# Patient Record
Sex: Female | Born: 1980 | Race: White | Hispanic: No | Smoking: Never smoker
Health system: Southern US, Community
[De-identification: ages and names within clinical notes are randomized; demographics above are authoritative.]

## PROBLEM LIST (undated history)

## (undated) DIAGNOSIS — J45909 Unspecified asthma, uncomplicated: Secondary | ICD-10-CM

## (undated) DIAGNOSIS — F0781 Postconcussional syndrome: Secondary | ICD-10-CM

## (undated) DIAGNOSIS — G43909 Migraine, unspecified, not intractable, without status migrainosus: Secondary | ICD-10-CM

---

## 2017-11-16 ENCOUNTER — Other Ambulatory Visit: Payer: Self-pay

## 2017-11-16 ENCOUNTER — Ambulatory Visit
Admission: EM | Admit: 2017-11-16 | Discharge: 2017-11-16 | Disposition: A | Discharge status: Home / Self Care | Payer: BC Managed Care – PPO | Attending: Emergency Medicine | Admitting: Emergency Medicine

## 2017-11-16 DIAGNOSIS — J4521 Mild intermittent asthma with (acute) exacerbation: Secondary | ICD-10-CM

## 2017-11-16 DIAGNOSIS — J069 Acute upper respiratory infection, unspecified: Secondary | ICD-10-CM | POA: Diagnosis not present

## 2017-11-16 HISTORY — DX: Postconcussional syndrome: F07.81

## 2017-11-16 HISTORY — DX: Unspecified asthma, uncomplicated: J45.909

## 2017-11-16 HISTORY — DX: Migraine, unspecified, not intractable, without status migrainosus: G43.909

## 2017-11-16 MED ORDER — DEXAMETHASONE 4 MG PO TABS: ORAL_TABLET | ORAL | 0 refills | Status: DC

## 2017-11-16 MED ORDER — ALBUTEROL SULFATE HFA 108 (90 BASE) MCG/ACT IN AERS: 1.0000 | INHALATION_SPRAY | Freq: Four times a day (QID) | RESPIRATORY_TRACT | 0 refills | Status: AC | PRN

## 2017-11-16 MED ORDER — AEROCHAMBER PLUS MISC: 2 refills | Status: AC

## 2017-11-16 MED ORDER — DEXAMETHASONE SODIUM PHOSPHATE 10 MG/ML IJ SOLN
10.0000 mg | Freq: Once | INTRAMUSCULAR | Status: AC
  Administered 2017-11-16: 10 mg via INTRAMUSCULAR

## 2017-11-16 NOTE — ED Provider Notes (Signed)
HPI  SUBJECTIVE:  Autumn Smith is a 37 y.o. female who presents with diffuse chest tightness, shortness of breath, dyspnea on exertion, nonproductive cough for the past several days.  She states that she has been checking her peak flows at home and states that they have been consistently lower than normal in the 400s.  She states her baseline peak flow is 500.  She states that she had an upper respiratory infection with nasal congestion, rhinorrhea, postnasal drip, which is largely resolved but states that is triggered off her asthma.  She has been using her albuterol neb 3-4 times a day with improvement in her symptoms.  She states that normally she never uses the albuterol.  She has also tried over-the-counter cold medicines.  She states that her symptoms are worse at night and when she does not get her albuterol nebs on a regular basis.  She denies calf pain, swelling, hemoptysis, surgery in the past 4 weeks, exogenous estrogen, prolonged immobilization, PE, DVT.  No posttussive emesis.  No unintentional weight gain, nocturia, orthopnea, PND, abdominal pain.  No antibiotics in the past month.  She did take an antipyretic within 6-8 hours of evaluation.  She also states that she had a nebulizer treatment immediately prior to arrival.  She has a past medical history of asthma, no recent steroid use, admissions or intubations.  She has a past medical history of migraines.  No history of PE, DVT, CHF, diabetes, hypertension.  LMP: 12/31.  She denies the possibility of being pregnant.  NWG:NFAOZ, Leotis Shames, MD    Past Medical History:  Diagnosis Date  . Asthma   . Migraines   . Post concussion syndrome     History reviewed. No pertinent surgical history.  History reviewed. No pertinent family history.  Social History   Tobacco Use  . Smoking status: Never Smoker  . Smokeless tobacco: Never Used  Substance Use Topics  . Alcohol use: No    Frequency: Never  . Drug use: No     Current  Facility-Administered Medications:  .  dexamethasone (DECADRON) injection 10 mg, 10 mg, Intramuscular, Once, Domenick Gong, MD  Current Outpatient Medications:  .  albuterol (PROVENTIL) (2.5 MG/3ML) 0.083% nebulizer solution, Take 2.5 mg by nebulization every 6 (six) hours as needed for wheezing or shortness of breath., Disp: , Rfl:  .  cetirizine (ZYRTEC) 10 MG tablet, Take 10 mg by mouth daily., Disp: , Rfl:  .  ETODOLAC PO, Take by mouth., Disp: , Rfl:  .  guaiFENesin (MUCINEX) 600 MG 12 hr tablet, Take by mouth 2 (two) times daily., Disp: , Rfl:  .  nortriptyline (PAMELOR) 10 MG capsule, Take 30 mg by mouth at bedtime., Disp: , Rfl:  .  Pseudoephedrine-DM-GG (SUDAFED COUGH PO), Take by mouth., Disp: , Rfl:  .  albuterol (PROVENTIL HFA;VENTOLIN HFA) 108 (90 Base) MCG/ACT inhaler, Inhale 1-2 puffs into the lungs every 6 (six) hours as needed for wheezing or shortness of breath., Disp: 1 Inhaler, Rfl: 0 .  dexamethasone (DECADRON) 4 MG tablet, 4 tablets (16 mg) po at once tomorrow, Disp: 4 tablet, Rfl: 0 .  Spacer/Aero-Holding Chambers (AEROCHAMBER PLUS) inhaler, Use as instructed, Disp: 1 each, Rfl: 2  No Known Allergies   ROS  As noted in HPI.   Physical Exam  BP 134/73 (BP Location: Left Arm)   Pulse (!) 129   Temp 98.1 F (36.7 C) (Oral)   Ht 5\' 6"  (1.676 m)   Wt 125 lb (56.7 kg)  LMP 11/03/2017 (Approximate)   SpO2 100%   BMI 20.18 kg/m   Constitutional: Well developed, well nourished, no acute distress Eyes:  EOMI, conjunctiva normal bilaterally HENT: Normocephalic, atraumatic,mucus membranes moist Respiratory: Normal inspiratory effort, good air movement, no rales, rhonchi, wheezing.  No chest wall tenderness Cardiovascular: Regular tachycardia, no murmurs, rubs, gallops. GI: nondistended skin: No rash, skin intact Musculoskeletal: Calves symmetric, nontender, no edema.  No deformities Neurologic: Alert & oriented x 3, no focal neuro deficits Psychiatric:  Speech and behavior appropriate   ED Course   Medications  dexamethasone (DECADRON) injection 10 mg (not administered)    Orders Placed This Encounter  Procedures  . Peak flow    Standing Status:   Standing    Number of Occurrences:   1    Order Specific Question:   Pre and post Neb    Answer:   Yes    No results found for this or any previous visit (from the past 24 hour(s)). No results found.  ED Clinical Impression  Mild intermittent asthma with exacerbation  Upper respiratory tract infection, unspecified type   ED Assessment/Plan  Patient states her best peak flow is 500.  It is 350/420/420 here in the clinic.  She declined another nebulizer treatment, saying that she had one immediately prior to arrival and was still feeling tachycardic/shaky.  Presentation consistent with an asthma exacerbation due to a URI.  Doubt PE, CHF.  Will give dexamethasone 10 mg IM here, prescribe an albuterol inhaler with a spacer.  16 mg of dexamethasone for her to take po at once tomorrow.  She will need no further steroids after that.  She will do 2 puffs from her albuterol inhaler every 4-6 hours, continue keeping a log of her peak flows.  She will follow up with her primary care physician as needed, and will go to the ER if she gets worse.  Imaging deferred as patient had no fevers, focal lung findings.  Discussed MDM, plan and followup with patient. Discussed sn/sx that should prompt return to the ED. patient agrees with plan.   Meds ordered this encounter  Medications  . dexamethasone (DECADRON) injection 10 mg  . Spacer/Aero-Holding Chambers (AEROCHAMBER PLUS) inhaler    Sig: Use as instructed    Dispense:  1 each    Refill:  2  . albuterol (PROVENTIL HFA;VENTOLIN HFA) 108 (90 Base) MCG/ACT inhaler    Sig: Inhale 1-2 puffs into the lungs every 6 (six) hours as needed for wheezing or shortness of breath.    Dispense:  1 Inhaler    Refill:  0  . dexamethasone (DECADRON) 4 MG  tablet    Sig: 4 tablets (16 mg) po at once tomorrow    Dispense:  4 tablet    Refill:  0    *This clinic note was created using Scientist, clinical (histocompatibility and immunogenetics)Dragon dictation software. Therefore, there may be occasional mistakes despite careful proofreading.   ?   Domenick GongMortenson, Blayton Huttner, MD 11/16/17 1323

## 2017-11-16 NOTE — Discharge Instructions (Signed)
Take two puffs from your albuterol inhaler every 4 hours. Finish the steroids unless your doctor tells you to stop.  Do a peak flow, once the morning and once at night. Write this down. The number should be going up, not down. You may decrease the frequency of your albuterol inhaler as the numbers go up and you start feeling better. You may start the steroids tomorrow, as you have already taken today's dose. You may take tylenol 1 gram up to 4 times a day as needed for pain. This with 600 mg of motrin is an effective combination for pain and fever. Make sure you drink extra fluids. Return if you get worse, have a fever >100.4, or any other concerns.   Go to www.goodrx.com to look up your medications. This will give you a list of where you can find your prescriptions at the most affordable prices. Or ask the pharmacist what the cash price is, or if they have any other discount programs available to help make your medication more affordable. This can be less expensive than what you would pay with insurance.

## 2017-11-16 NOTE — ED Triage Notes (Signed)
Patient has a hx of asthma, she states she has had a cough and sob for 6 days. Patient states she has been doing 3-4 albuterol treatments a day since Monday. She had her last nebulizer treatment this morning at 9 am. Patient also states she was just started on noritriptyline and read the insert which states not to take this with albuterol.

## 2017-11-19 ENCOUNTER — Telehealth: Payer: Self-pay

## 2017-11-19 NOTE — Telephone Encounter (Signed)
Called to follow up with patient since visit here at [facility]. Patient instructed to call back with any questions or concerns. MAH  

## 2018-06-18 ENCOUNTER — Ambulatory Visit (INDEPENDENT_AMBULATORY_CARE_PROVIDER_SITE_OTHER): Payer: BC Managed Care – PPO

## 2018-06-18 ENCOUNTER — Encounter: Payer: Self-pay | Admitting: Emergency Medicine

## 2018-06-18 ENCOUNTER — Other Ambulatory Visit: Payer: Self-pay

## 2018-06-18 ENCOUNTER — Ambulatory Visit
Admission: EM | Admit: 2018-06-18 | Discharge: 2018-06-18 | Disposition: A | Discharge status: Home / Self Care | Payer: BC Managed Care – PPO | Attending: Family Medicine | Admitting: Family Medicine

## 2018-06-18 DIAGNOSIS — S99922A Unspecified injury of left foot, initial encounter: Secondary | ICD-10-CM | POA: Diagnosis not present

## 2018-06-18 DIAGNOSIS — W2209XA Striking against other stationary object, initial encounter: Secondary | ICD-10-CM | POA: Diagnosis not present

## 2018-06-18 DIAGNOSIS — M79672 Pain in left foot: Secondary | ICD-10-CM

## 2018-06-18 MED ORDER — MELOXICAM 15 MG PO TABS: 15.0000 mg | ORAL_TABLET | Freq: Every day | ORAL | 0 refills | Status: AC | PRN

## 2018-06-18 NOTE — ED Provider Notes (Signed)
[facility]    CSN: [id number] Arrival date & time: 06/18/18  0901  History   Chief Complaint Chief Complaint  Patient presents with  . Foot Pain   HPI  37 year old female presents to the urgent care with complaints of left foot pain.  Left foot pain  Injury 3 weeks ago. Was sleeping in a basement and kicked an entertainment center accidentally during sleep.   Patient reports that the injury was to the dorsum of the left foot.  Patient reports that the pain has not improved.  Moderate in severity.  She states that it still hurts when she ambulates.  Patient states she does not feel like it is broken.  She is not taking any medication or trying any interventions.  No known relieving factors.  No other associated symptoms.  No other complaints.  Past Medical History:  Diagnosis Date  . Asthma   . Migraines   . Post concussion syndrome    History reviewed. No pertinent surgical history.  OB History   None    Home Medications    Prior to Admission medications   Medication Sig Start Date End Date Taking? Authorizing Provider  Fluticasone-Salmeterol (ADVAIR) 250-50 MCG/DOSE AEPB Inhale 1 puff into the lungs 2 (two) times daily.   Yes [provider]  venlafaxine (EFFEXOR) 37.5 MG tablet Take 75 mg by mouth once.   Yes [provider]  albuterol (PROVENTIL HFA;VENTOLIN HFA) 108 (90 Base) MCG/ACT inhaler Inhale 1-2 puffs into the lungs every 6 (six) hours as needed for wheezing or shortness of breath. 11/16/17   Domenick GongMortenson, Ashley, MD  albuterol (PROVENTIL) (2.5 MG/3ML) 0.083% nebulizer solution Take 2.5 mg by nebulization every 6 (six) hours as needed for wheezing or shortness of breath.    [provider]  cetirizine (ZYRTEC) 10 MG tablet Take 10 mg by mouth daily.    [provider]  meloxicam (MOBIC) 15 MG tablet Take 1 tablet (15 mg total) by mouth daily as needed. 06/18/18   Tommie Samsook, Dazaria Macneill G, DO  Spacer/Aero-Holding  Chambers (AEROCHAMBER PLUS) inhaler Use as instructed 11/16/17   Domenick GongMortenson, Ashley, MD   Social History Social History   Tobacco Use  . Smoking status: Never Smoker  . Smokeless tobacco: Never Used  Substance Use Topics  . Alcohol use: No    Frequency: Never  . Drug use: No    Allergies   Patient has no known allergies.   Review of Systems Review of Systems  Musculoskeletal:       Left foot pain.  Skin:       No bruising noted.   Physical Exam Triage Vital Signs ED Triage Vitals  Enc Vitals Group     BP 06/18/18 0914 117/82     Pulse Rate 06/18/18 0914 (!) 102     Resp 06/18/18 0914 14     Temp 06/18/18 0914 98.4 F (36.9 C)     Temp Source 06/18/18 0914 Oral     SpO2 06/18/18 0914 100 %     Weight 06/18/18 0914 125 lb (56.7 kg)     Height 06/18/18 0914 5\' 6"  (1.676 m)     Head Circumference --      Peak Flow --      Pain Score 06/18/18 0913 4     Pain Loc --      Pain Edu? --      Excl. in GC? --    Updated Vital Signs BP 117/82 (BP Location: Left Arm)  Pulse (!) 102   Temp 98.4 F (36.9 C) (Oral)   Resp 14   Ht 5\' 6"  (1.676 m)   Wt 56.7 kg   LMP 06/04/2018 (Approximate) Comment: denies preg  SpO2 100%   BMI 20.18 kg/m   Visual Acuity Right Eye Distance:   Left Eye Distance:   Bilateral Distance:    Right Eye Near:   Left Eye Near:    Bilateral Near:     Physical Exam  Constitutional: She is oriented to person, place, and time. She appears well-developed. No distress.  Pulmonary/Chest: Effort normal. No respiratory distress.  Musculoskeletal:  Left foot -normal inspection.  No discrete areas of tenderness on exam.  Normal range of motion.  Normal range of motion of the ankle as well. Pes planus noted.  Neurological: She is alert and oriented to person, place, and time.  Skin: Skin is warm.  No bruising.  Psychiatric: She has a normal mood and affect. Her behavior is normal.  Nursing note and vitals reviewed.  UC Treatments / Results    Labs (all labs ordered are listed, but only abnormal results are displayed) Labs Reviewed - No data to display  EKG None  Radiology Dg Foot Complete Left  Result Date: 06/18/2018 CLINICAL DATA:  37 year old female with 3 weeks of pain after kicking piece of furniture. Fourth metatarsal and plantar 4th MTP region pain. EXAM: LEFT FOOT - COMPLETE 3+ VIEW COMPARISON:  None. FINDINGS: Bone mineralization is within normal limits. There is no evidence of fracture or dislocation. Normal joint spaces and alignment. No soft tissue abnormality identified. IMPRESSION: Negative. Electronically Signed   By: Odessa FlemingH  Hall M.D.   On: 06/18/2018 09:57   Procedures Procedures (including critical care time)  Medications Ordered in UC Medications - No data to display  Initial Impression / Assessment and Plan / UC Course  I have reviewed the triage vital signs and the nursing notes.  Pertinent labs & imaging results that were available during my care of the patient were reviewed by me and considered in my medical decision making (see chart for details).    37 year old female presents with a foot injury.  Her exam is benign.  X-ray negative.  I advised meloxicam as needed.  Slow return to normal activities.  Arch support and metatarsal pad.  Final Clinical Impressions(s) / UC Diagnoses   Final diagnoses:  Injury of left foot, initial encounter     Discharge Instructions     Slow return to normal activity.  Medication as needed. Will help with pain & inflammation.  Take care  Dr. Adriana Simasook     ED Prescriptions    Medication Sig Dispense Auth. Provider   meloxicam (MOBIC) 15 MG tablet Take 1 tablet (15 mg total) by mouth daily as needed. 30 tablet Tommie Samsook, Dalisa Forrer G, DO     Controlled Substance Prescriptions [place] Controlled Substance Registry consulted? Not Applicable   Tommie SamsCook, Amesha Bailey G, DO 06/18/18 1016

## 2018-06-18 NOTE — ED Triage Notes (Signed)
Patient states that 3 weeks ago she kicked that entertainment center with her left foot during her sleep.  Patient c.o left foot pain.

## 2018-06-18 NOTE — Discharge Instructions (Signed)
Slow return to normal activity.  Medication as needed. Will help with pain & inflammation.  Take care  Dr. Adriana Simasook

## 2019-03-06 IMAGING — CR DG FOOT COMPLETE 3+V*L*
3 series · 3 of 3 positions shown · non-contrast
Comparison: None.

CLINICAL DATA: 37-year-old female with 3 weeks of pain after
kicking piece of furniture. Fourth metatarsal and plantar 4th MTP
region pain.

EXAM:
LEFT FOOT - COMPLETE 3+ VIEW

[foot ap]
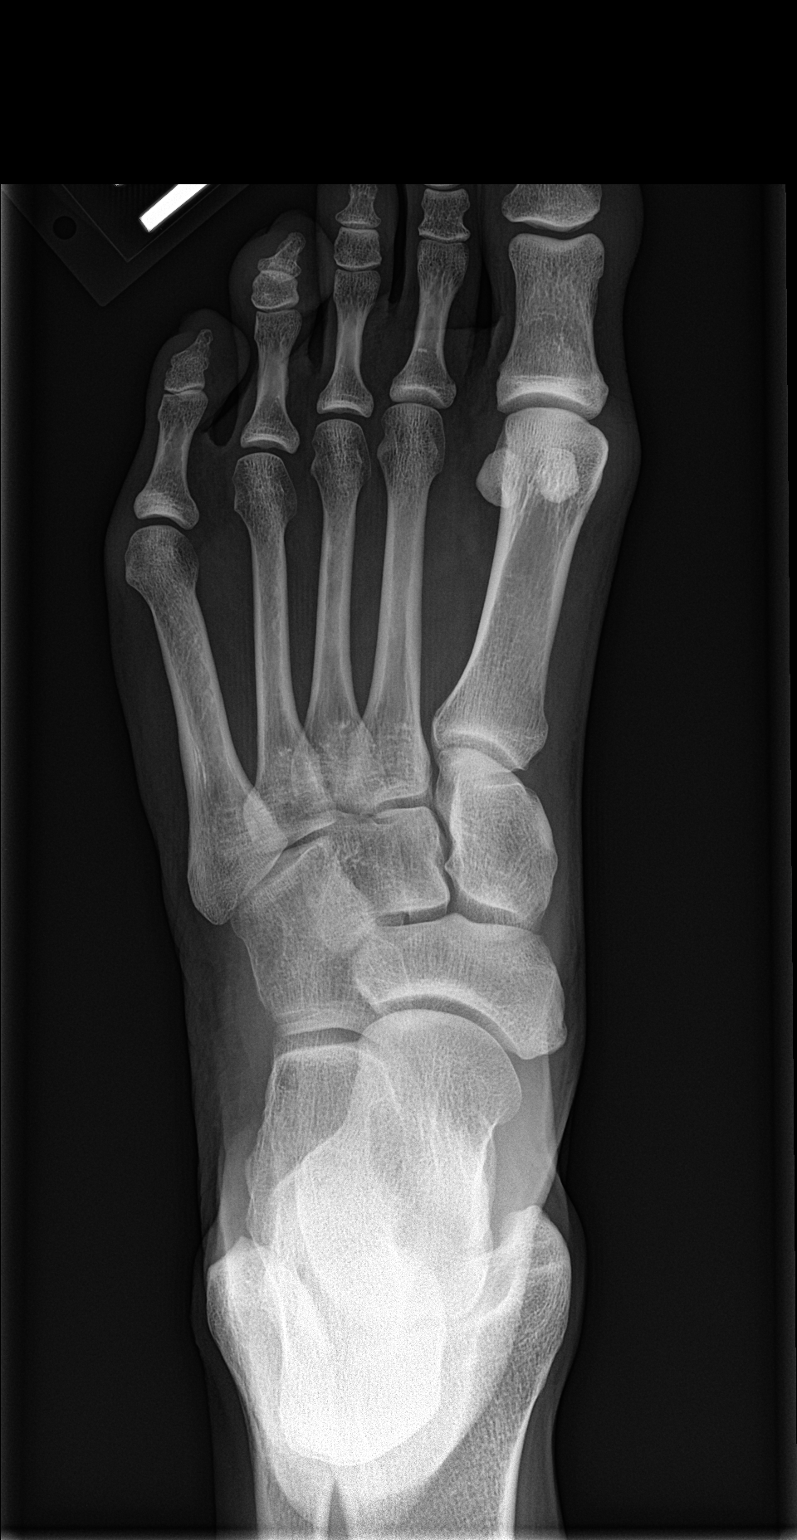

[foot obl]
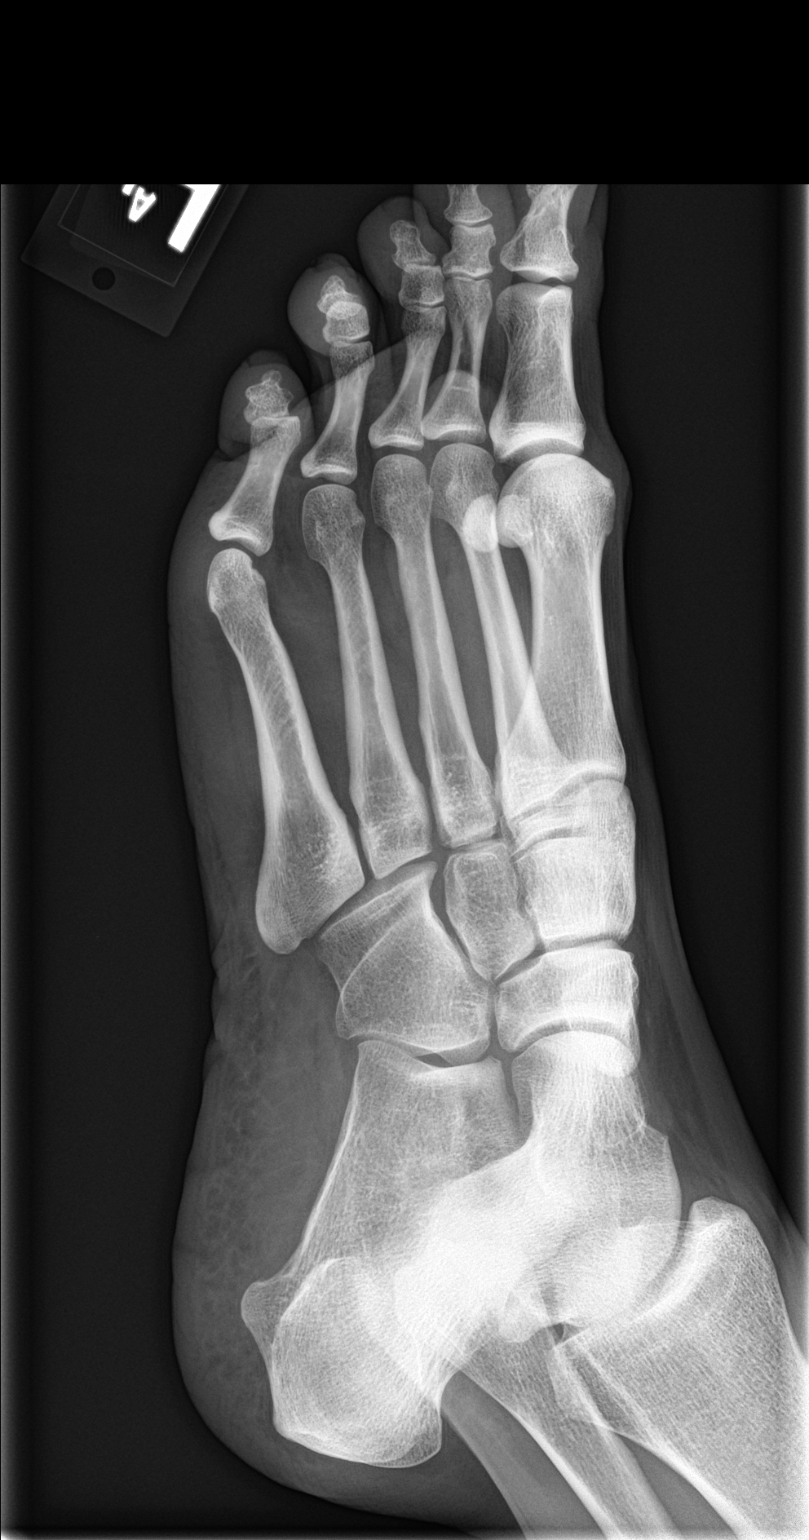

[foot lat]
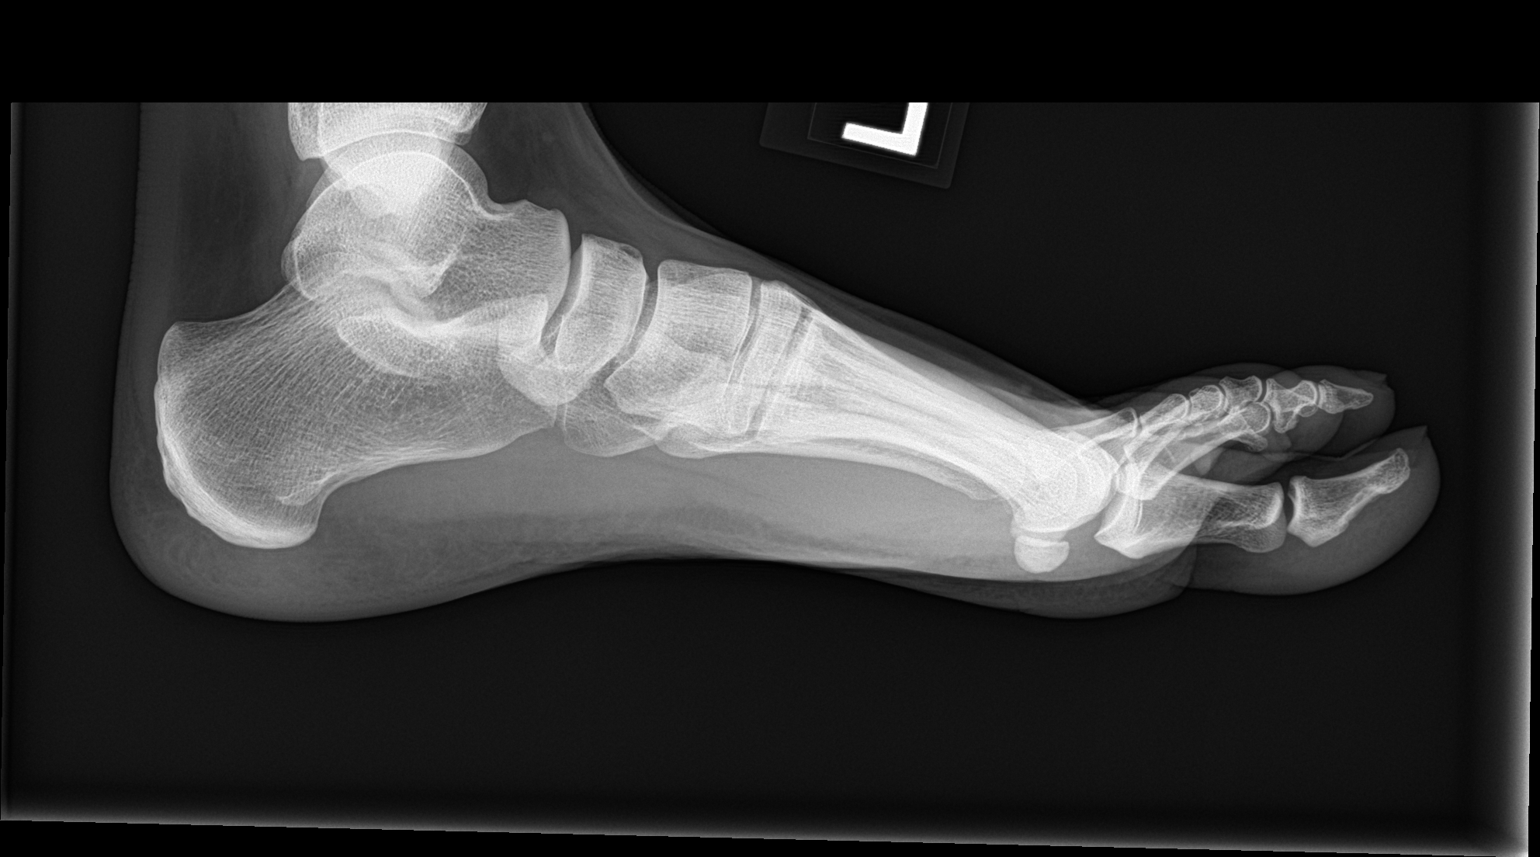

[3 of 3 positions shown; findings below may reference images not displayed]

FINDINGS: Bone mineralization is within normal limits. There is no evidence of
fracture or dislocation. Normal joint spaces and alignment. No soft
tissue abnormality identified.
IMPRESSION: Negative.

## 2020-08-15 ENCOUNTER — Encounter: Payer: Self-pay | Admitting: Physical Therapy

## 2020-08-15 ENCOUNTER — Other Ambulatory Visit: Payer: Self-pay

## 2020-08-15 ENCOUNTER — Ambulatory Visit: Payer: BC Managed Care – PPO | Attending: Family Medicine | Admitting: Physical Therapy

## 2020-08-15 DIAGNOSIS — S0990XS Unspecified injury of head, sequela: Secondary | ICD-10-CM | POA: Insufficient documentation

## 2020-08-15 DIAGNOSIS — G44309 Post-traumatic headache, unspecified, not intractable: Secondary | ICD-10-CM | POA: Diagnosis present

## 2020-08-15 DIAGNOSIS — M6289 Other specified disorders of muscle: Secondary | ICD-10-CM | POA: Diagnosis present

## 2020-08-15 DIAGNOSIS — M542 Cervicalgia: Secondary | ICD-10-CM | POA: Insufficient documentation

## 2020-08-15 NOTE — Therapy (Signed)
[facility] [address]. [place], [place], [address] Phone: [phone]   Fax:  [phone]  Physical Therapy Evaluation  Patient Details  Name: Autumn Smith MRN: [id number] Date of Birth: [birth year] No data recorded  Encounter Date: 08/15/2020   PT End of Session - 08/15/20 1207    Visit Number 1    Number of Visits 9    Date for PT Re-Evaluation 09/12/20    PT Start Time 1115    PT Stop Time 1212    PT Time Calculation (min) 57 min    Activity Tolerance Patient tolerated treatment well;No increased pain    Behavior During Therapy WFL for tasks assessed/performed           Past Medical History:  Diagnosis Date  . Asthma   . Migraines   . Post concussion syndrome     History reviewed. No pertinent surgical history.  There were no vitals filed for this visit.    Subjective Assessment - 08/15/20 1154    Subjective Pt is a 39 y.o. female referred to PT for dry needling of cervical spine musculature. She repors haivng a PMH of mutliple concussions and TBI's causing consistent, daily headaches and migraines that range typicaly at 5/10 NPS primarily behind the eyes. 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 Pt states she is currently being worked up for EDS. Current cervical and headaches rated at 2/10 NPS. Pt's goal with PT is to reduce headaches.    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    Limitations Other (comment)    Currently in Pain? Yes    Pain Score 2     Pain Location Head    Pain Orientation Posterior    Pain Descriptors / Indicators Aching;Discomfort    Pain Type  Chronic pain    Pain Onset More than a month ago    Pain Frequency Intermittent    Aggravating Factors  B lat cervical flexion causes ulnar nerve distribution pain.          OBJECTIVE  Mental Status Patient's fund of knowledge is within normal limits for educational level.  SENSATION: Grossly intact to light touch bilateral UE as determined by testing dermatomes C2-T2 Proprioception and hot/cold testing deferred on this date   MUSCULOSKELETAL: Tremor: None Bulk: Normal Tone: Normal  Posture  Slumped in sitting. Slight forward head and forward shoulders.  Palpation  TTP along cervical paraspinals and B upper traps with referred pain to ears.   Strength R/L 5/5 Shoulder flexion (anterior deltoid/pec major/coracobrachialis, axillary n. (C5/6) and musculocutaneous n. (C5-7)) 5/5 Shoulder abduction (deltoid/supraspinatus, axillary/suprascapular n, C5) 5/5 Shoulder external rotation (infraspinatus/teres minor) 5/5 Shoulder internal rotation (subcapularis/lats/pec major)   AROM R/L 80 Cervical Flexion 50 Cervical Extension 55/45* Cervical Lateral Flexion WFL*/WFL Cervical Rotation *Indicates pain, overpressure performed unless otherwise indicated    Passive Accessory Intervertebral Motion (PAIVM) Pt denies reproduction of neck pain with CPA C2-T7 Generally hypermobile throughout. However reports tenderness and pain but is different from the pain from her headaches.  FOTO: 69/66    TREATMENT:   There.ex: Education on progressive HEP. Educated on frequency, reps, sets of cervical retractions, pec  wall stretch, B upper trap stretch, scap retractions, and B shoulder ER with TB.   Dry Needling: prone position with needling to R/L UT trigger points and R suboccipital region.  6 needles used with significant muscle fasciculations noted during pistoning technique.  Good tx. Tolerance and STM to B UT region after needling.         Objective measurements completed on  examination: See above findings.        PT Education - 08/15/20 1207    Education Details form/technique with exercise. HEP.    Person(s) Educated Patient    Methods Explanation;Demonstration;Tactile cues;Verbal cues;Handout    Comprehension Verbalized understanding;Returned demonstration               PT Long Term Goals - 08/15/20 1524      PT LONG TERM GOAL #1   Title Pt will report  headaches <2/10 pain at work due to improved posture from therapy treatment.    Baseline 10/12: Pain up to 5/10 NPS    Time 4    Period Weeks    Status New    Target Date 09/12/20      PT LONG TERM GOAL #2   Title Pt will improve DNF time to > 1 min 30 sec with reports of no cervical pain to display improvement in cervical stability due to hypermobility of vertebrae.    Baseline 10/12: DNF time is1 min with reports of post cervical muscle pain.    Time 4    Period Weeks    Status New    Target Date 09/12/20      PT LONG TERM GOAL #3   Title Pt will have no reports of pain in ulnar distribution with B upper trap stretch to improve ability to use hands for job related tasks.    Baseline 10/12: pain in ulnar distribution bilaterally.    Time 4    Period Weeks    Status New    Target Date 09/12/20                  Plan - 08/15/20 1513    Clinical Impression Statement Pt is a 39 y.o. female referred to PT for dry needling of cervical musculature. Pt has full cervical AROM with pain with B lat cervical flexion that refers to B ears. Also with reports of pain in ulnar distribution with closing down of cervical vertabrae with contralateral lat flexion. Pt is TTP along B upper traps with noted trigger points that refer pain to ears. Pt also presents with gross 5/5 MMT strength in BUE's with no pain. Pt displays hypermobility with CPA's in cervical spine and upper thoracic with reports of pain with CPA's. Pt able to perform deep neck flexor endurance test for 1 min with good  form/technique. Dry needling performed along B upper traps and cervical paraspinals to reduce trigger points and cervical/suboccipital pain due to frequent flexed posture for nursing job.These impairments affect pt's ability to eprform job tasks due to frequent migranes and headaches due to frequent flexed posture. Pt can benefit from skilled PT treatment to treat these impairments and improve functional mobility.    Personal Factors and Comorbidities Age;Comorbidity 3+;Past/Current Experience    Examination-Participation Restrictions Community Activity;Occupation    Stability/Clinical Decision Making Evolving/Moderate complexity    Clinical Decision Making Moderate    Rehab Potential Fair    PT Frequency 4x / week    PT Duration 4 weeks    PT Treatment/Interventions ADLs/Self Care Home  Management;Cryotherapy;Electrical Stimulation;Moist Heat;Traction;Functional mobility training;Therapeutic activities;Therapeutic exercise;Neuromuscular re-education;Patient/family education;Dry needling;Manual techniques    PT Next Visit Plan Reasses HEP, thoracic/periscap strength, dry needling.    PT Home Exercise Plan Cervical retractions, scap retractions with resistance, upper trap stretch,pec stretch on wall. B shoulder ER with resistance.    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  Cammie Mcgee, PT, DPT # [id number] Ronnie Derby, SPT 08/15/2020, 3:30 PM  [facility] [address] [place], [place], [address] Phone: [phone]   Fax:  [phone]  Name: Autumn Smith MRN:  [id number] Date of Birth: 31-Oct-1981

## 2020-08-22 ENCOUNTER — Other Ambulatory Visit: Payer: Self-pay

## 2020-08-22 ENCOUNTER — Ambulatory Visit: Payer: BC Managed Care – PPO | Admitting: Physical Therapy

## 2020-08-22 DIAGNOSIS — M542 Cervicalgia: Secondary | ICD-10-CM | POA: Diagnosis not present

## 2020-08-22 DIAGNOSIS — M6289 Other specified disorders of muscle: Secondary | ICD-10-CM

## 2020-08-22 DIAGNOSIS — S0990XS Unspecified injury of head, sequela: Secondary | ICD-10-CM

## 2020-08-22 DIAGNOSIS — G44309 Post-traumatic headache, unspecified, not intractable: Secondary | ICD-10-CM

## 2020-08-23 ENCOUNTER — Encounter: Payer: Self-pay | Admitting: Physical Therapy

## 2020-08-23 NOTE — Therapy (Signed)
[facility] [address] [place], [place], [address] Phone: [phone]   Fax:  [phone]  Physical Therapy Treatment  Patient Details  Name: Nandita Mathenia MRN: [id number] Date of Birth: 04-24-1981 Referring Provider (PT): Dr. Cathey Endow   Encounter Date: 08/22/2020   PT End of Session - 08/23/20 0719    Visit Number 2    Number of Visits 9    Date for PT Re-Evaluation 09/12/20    Authorization - Visit Number 2    Authorization - Number of Visits 10    PT Start Time 1559    PT Stop Time 1646    PT Time Calculation (min) 47 min    Activity Tolerance Patient tolerated treatment well;No increased pain    Behavior During Therapy WFL for tasks assessed/performed           Past Medical History:  Diagnosis Date  . Asthma   . Migraines   . Post concussion syndrome     History reviewed. No pertinent surgical history.  There were no vitals filed for this visit.   Subjective Assessment - 08/23/20 0717    Subjective Pt reports compliance with HEP. Reports that cervical retractions made ulnar distribution radicular symptoms worse. Pt not sure if dry needling was beneficial from last session but not adverse response.    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    Limitations Other (comment)    Patient Stated Goals Decrease neck pain    Currently in Pain? Yes    Pain Score 2     Pain Location Head    Pain Orientation Posterior    Pain Descriptors / Indicators Aching;Discomfort    Pain Type Chronic pain    Pain Onset More than a month ago    Pain Frequency Intermittent            There.ex:   Reassessment of HEP for first 5 min.   Adjusted cervical retractions against small ball on wall: 2x10, with reported reductions in radicular symptoms  Seated cervical isometrics: flexion, ext, R/L side bending: 1x5 with 5 sec holds   Standing resisted low row shoulder  extension: red latex free TB. Min verbal and tactile cues for form/technique. 2x15  Standing resisted scap retractions with latex free red TB: 2x15   Pt educated on scalene stretches due to hx of TOS: B, 1x3, 20 sec holds. Pt educated to discontinue if causing radicular symptoms down UE's.    Manual:   Prone STM to B UT/suboccipital region prior to trigger point dry needling to R/L UT trigger points.  6 needles used at multiple trigger points (multiple fasciculations noted).  Good tolerance.  Use of hypervolt to R/L UT muscular after tx. Session.          PT Education - 08/23/20 0719    Education Details form/technique with exercise.    Person(s) Educated Patient    Methods Explanation;Demonstration    Comprehension Verbalized understanding;Returned demonstration               PT Long Term Goals - 08/15/20 1524      PT LONG TERM GOAL #1   Title Pt will report  headaches <2/10 pain at work due to improved posture from therapy treatment.    Baseline 10/12: Pain up to 5/10 NPS    Time 4    Period Weeks    Status New    Target Date 09/12/20  PT LONG TERM GOAL #2   Title Pt will improve DNF time to > 1 min 30 sec with reports of no cervical pain to display improvement in cervical stability due to hypermobility of vertebrae.    Baseline 10/12: DNF time is1 min with reports of post cervical muscle pain.    Time 4    Period Weeks    Status New    Target Date 09/12/20      PT LONG TERM GOAL #3   Title Pt will have no reports of pain in ulnar distribution with B upper trap stretch to improve ability to use hands for job related tasks.    Baseline 10/12: pain in ulnar distribution bilaterally.    Time 4    Period Weeks    Status New    Target Date 09/12/20                 Plan - 08/23/20 0721    Clinical Impression Statement No adverse responses from dry needling from previous session. Adjusted cervical retraction into ball on wall with improvement in  radicular symptoms compared to no ball at home. 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 Closing of joint space causes radicular pain. Educated if making symptoms worse then to discontinue. Pt tolerated dry needling well with redcution in trigger points in suboccipital region. Progress there.ex and PT as tolerated.    Personal Factors and Comorbidities Age;Comorbidity 3+;Past/Current Experience    Examination-Participation Restrictions Community Activity;Occupation    Stability/Clinical Decision Making Evolving/Moderate complexity    Clinical Decision Making Moderate    Rehab Potential Fair    PT Frequency 2x / week    PT Duration 4 weeks    PT Treatment/Interventions ADLs/Self Care Home Management;Cryotherapy;Electrical Stimulation;Moist Heat;Traction;Functional mobility training;Therapeutic activities;Therapeutic exercise;Neuromuscular re-education;Patient/family education;Dry needling;Manual techniques    PT Next Visit Plan Reasses HEP, thoracic/periscap strength, dry needling.    PT Home Exercise Plan Cervical retractions, scap retractions with resistance, upper trap stretch,pec stretch on wall. B shoulder ER with resistance.    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  Cammie Mcgee, PT, DPT # [phone] 08/23/2020, 9:09 AM  [facility] [address] [place], [place], [address] Phone: [phone]   Fax:  [phone]  Name: Marua Qin MRN: [id number] Date of Birth: [birth year]

## 2020-08-28 ENCOUNTER — Encounter: Payer: Self-pay | Admitting: Physical Therapy

## 2020-08-28 ENCOUNTER — Other Ambulatory Visit: Payer: Self-pay

## 2020-08-28 ENCOUNTER — Ambulatory Visit: Payer: BC Managed Care – PPO

## 2020-08-28 DIAGNOSIS — M542 Cervicalgia: Secondary | ICD-10-CM

## 2020-08-28 DIAGNOSIS — S0990XS Unspecified injury of head, sequela: Secondary | ICD-10-CM

## 2020-08-28 DIAGNOSIS — G44309 Post-traumatic headache, unspecified, not intractable: Secondary | ICD-10-CM

## 2020-08-28 DIAGNOSIS — M6289 Other specified disorders of muscle: Secondary | ICD-10-CM

## 2020-08-28 NOTE — Therapy (Signed)
[facility] [address] [place], [place], [address] Phone: [phone]   Fax:  [phone]  Physical Therapy Treatment  Patient Details  Name: Autumn Smith MRN: [id number] Date of Birth: 1981/08/05 Referring Provider (PT): Dr. Cathey Endow   Encounter Date: 08/28/2020   PT End of Session - 08/28/20 1543    Visit Number 3    Number of Visits 9    Date for PT Re-Evaluation 09/12/20    Authorization - Visit Number 3    Authorization - Number of Visits 10    PT Start Time 1429    PT Stop Time 1512    PT Time Calculation (min) 43 min    Activity Tolerance Patient tolerated treatment well;No increased pain    Behavior During Therapy WFL for tasks assessed/performed           Past Medical History:  Diagnosis Date  . Asthma   . Migraines   . Post concussion syndrome     History reviewed. No pertinent surgical history.  There were no vitals filed for this visit.   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 Soreness in B RTC from heavy lifting over the weekend.    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    Limitations Other (comment)    Patient Stated Goals Decrease neck pain    Currently in Pain? No/denies    Pain Onset More than a month ago          There.ex:   Supine open books for thoracic extension on small bolster along spine: B 1x8   Side-lying open books on blue mat table due to reports of Ulnar nerve pain from hyperextended elbows: 1x10 bilaterally. No ulnar nerve pain reported.  Resisted exercises at [employer]:   Standing scap retractions: 2x12, 30 lbs. Mod verbal and tactile cues for form/technique    Standing alternating horizontal abduction: 2x12, 20 lbs. Mod verbal and tactile cues for form/technique.   Standing B shoulder  extension: 2x8, 20 lbs. Mod verbal and tactile cues for form/technique    Seated lat pull down: 2x12, 40 lbs. Mod verbal and tactile cues for form/technique    Standing chest press: 1x15, 20 lbs. Mod verbal and tactile cues for form/technique    PT Education - 08/28/20 1542    Education Details form/technique with exercise.    Person(s) Educated Patient    Methods Explanation;Demonstration;Tactile cues;Verbal cues    Comprehension Verbalized understanding;Returned demonstration               PT Long Term Goals - 08/15/20 1524      PT LONG TERM GOAL #1   Title Pt will report  headaches <2/10 pain at work due to improved posture from therapy treatment.    Baseline 10/12: Pain up to 5/10 NPS    Time 4    Period Weeks    Status New    Target Date 09/12/20      PT LONG TERM GOAL #2   Title Pt will improve DNF time to > 1 min 30 sec with reports of no cervical pain to display improvement in cervical stability due to hypermobility of vertebrae.    Baseline 10/12: DNF time is1 min with reports of post cervical muscle pain.    Time 4  Period Weeks    Status New    Target Date 09/12/20      PT LONG TERM GOAL #3   Title Pt will have no reports of pain in ulnar distribution with B upper trap stretch to improve ability to use hands for job related tasks.    Baseline 10/12: pain in ulnar distribution bilaterally.    Time 4    Period Weeks    Status New    Target Date 09/12/20                 Plan - 08/28/20 1543    Clinical Impression Statement Pt unsure if dry needling has improved symptoms for headaches. Pt educated and performed thoracic mobility exercises and periscapular stabilization exercises withmin to mod verbal and tactile cues for form/technique. Pt can continue to benefit from skilled PT treatment to improve work tasks and reduce headaches.    Personal Factors and Comorbidities Age;Comorbidity 3+;Past/Current Experience    Examination-Participation Restrictions  Community Activity;Occupation    Stability/Clinical Decision Making Evolving/Moderate complexity    Clinical Decision Making Moderate    Rehab Potential Fair    PT Frequency 2x / week    PT Duration 4 weeks    PT Treatment/Interventions ADLs/Self Care Home Management;Cryotherapy;Electrical Stimulation;Moist Heat;Traction;Functional mobility training;Therapeutic activities;Therapeutic exercise;Neuromuscular re-education;Patient/family education;Dry needling;Manual techniques    PT Next Visit Plan Periscap strength    PT Home Exercise Plan Cervical retractions, scap retractions with resistance, upper trap stretch,pec stretch on wall. B shoulder ER with resistance.    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   Ronnie Derby, SPT 08/29/2020, 12:59 PM   [facility] [address]. [place], [place], [address] Phone: [phone]   Fax:  [phone]  Name: Kandee Escalante MRN: [id number] Date of Birth: Sep 06, 1981

## 2020-09-05 ENCOUNTER — Other Ambulatory Visit: Payer: Self-pay

## 2020-09-05 ENCOUNTER — Ambulatory Visit: Payer: BC Managed Care – PPO | Attending: Family Medicine | Admitting: Physical Therapy

## 2020-09-05 DIAGNOSIS — M542 Cervicalgia: Secondary | ICD-10-CM

## 2020-09-05 DIAGNOSIS — G44309 Post-traumatic headache, unspecified, not intractable: Secondary | ICD-10-CM

## 2020-09-05 DIAGNOSIS — S0990XS Unspecified injury of head, sequela: Secondary | ICD-10-CM | POA: Diagnosis present

## 2020-09-05 DIAGNOSIS — M6289 Other specified disorders of muscle: Secondary | ICD-10-CM | POA: Diagnosis present

## 2020-09-06 ENCOUNTER — Encounter: Payer: BC Managed Care – PPO | Admitting: Physical Therapy

## 2020-09-07 ENCOUNTER — Encounter: Payer: Self-pay | Admitting: Physical Therapy

## 2020-09-07 NOTE — Therapy (Signed)
[facility] [address] [place], [place], [address] Phone: [phone]   Fax:  [phone]  Physical Therapy Treatment  Patient Details  Name: Autumn Smith MRN: [id number] Date of Birth: 1981/07/21 Referring Provider (PT): Dr. Cathey Endow   Encounter Date: 09/05/2020   PT End of Session - 09/07/20 1310    Visit Number 4    Number of Visits 9    Date for PT Re-Evaluation 09/12/20    Authorization - Visit Number 4    Authorization - Number of Visits 10    PT Start Time 1520    PT Stop Time 1603    PT Time Calculation (min) 43 min    Activity Tolerance Patient tolerated treatment well;No increased pain    Behavior During Therapy WFL for tasks assessed/performed           Past Medical History:  Diagnosis Date  . Asthma   . Migraines   . Post concussion syndrome     History reviewed. No pertinent surgical history.  There were no vitals filed for this visit.   Subjective Assessment - 09/07/20 1308    Subjective Pt. has reported an overall improvement in neck pain/ headaches.  Pt. states the improvement may be due to change in eyewear/ bifocals. No subjective pain score given today.    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    Limitations Other (comment)    Patient Stated Goals Decrease neck pain    Currently in Pain? --   no subjective pain score   Pain Onset More than a month ago               There.ex:   Seated UT stretches/ reassessment of B shoulder/ scapular mobility with upright posture Supine 2# chest press/ horizontal UE abd/adduction/ alternating shoulder flexion 20x each. Discussed HEP  Manual:   Supine UT stretches 3x20 sec.   Supine cervical rotation L/R (adjusted due to UE symptoms). Prone STM to B UT/suboccipital region prior to trigger point dry needling to R/L UT trigger points.  5 needles used at multiple trigger points (multiple  fasciculations noted).  Good tolerance.  Use of hypervolt to R/L UT muscular after tx. Session.       PT Long Term Goals - 08/15/20 1524      PT LONG TERM GOAL #1   Title Pt will report  headaches <2/10 pain at work due to improved posture from therapy treatment.    Baseline 10/12: Pain up to 5/10 NPS    Time 4    Period Weeks    Status New    Target Date 09/12/20      PT LONG TERM GOAL #2   Title Pt will improve DNF time to > 1 min 30 sec with reports of no cervical pain to display improvement in cervical stability due to hypermobility of vertebrae.    Baseline 10/12: DNF time is1 min with reports of post cervical muscle pain.    Time 4    Period Weeks    Status New    Target Date 09/12/20      PT LONG TERM GOAL #3   Title Pt will have no reports of pain in ulnar distribution with B upper trap stretch to improve ability to use hands for job related tasks.    Baseline 10/12: pain in ulnar distribution bilaterally.    Time 4    Period Weeks  Status New    Target Date 09/12/20                 Plan - 09/07/20 1311    Clinical Impression Statement Pt. presents with a decrease in B UT muscle tightness with 2 small trigger points noted in L/R UT musculature.  Good tx. tolerance with trigger point dry needling.  PT reenforced importance of posture/ correction with daily household and work tasks.  Pt. has progressive UE resisted ex. program at home with wts./ resistive bands.    Personal Factors and Comorbidities Age;Comorbidity 3+;Past/Current Experience    Examination-Participation Restrictions Community Activity;Occupation    Stability/Clinical Decision Making Evolving/Moderate complexity    Rehab Potential Fair    PT Frequency 2x / week    PT Duration 4 weeks    PT Treatment/Interventions ADLs/Self Care Home Management;Cryotherapy;Electrical Stimulation;Moist Heat;Traction;Functional mobility training;Therapeutic activities;Therapeutic exercise;Neuromuscular  re-education;Patient/family education;Dry needling;Manual techniques    PT Next Visit Plan Periscap strength.   1 more PT tx. session (CHECK GOALS).    PT Home Exercise Plan Cervical retractions, scap retractions with resistance, upper trap stretch,pec stretch on wall. B shoulder ER with resistance.    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  Cammie Mcgee, PT, DPT # [phone] 09/07/2020, 1:20 PM  [facility] [address] [place], [place], [address] Phone: [phone]   Fax:  [phone]  Name: Autumn Smith MRN: [id number] Date of Birth: Mar 25, 1981

## 2020-09-13 ENCOUNTER — Ambulatory Visit: Payer: BC Managed Care – PPO | Admitting: Physical Therapy

## 2020-09-13 ENCOUNTER — Other Ambulatory Visit: Payer: Self-pay

## 2020-09-13 DIAGNOSIS — M542 Cervicalgia: Secondary | ICD-10-CM

## 2020-09-13 DIAGNOSIS — G44309 Post-traumatic headache, unspecified, not intractable: Secondary | ICD-10-CM

## 2020-09-13 DIAGNOSIS — S0990XS Unspecified injury of head, sequela: Secondary | ICD-10-CM

## 2020-09-13 DIAGNOSIS — M6289 Other specified disorders of muscle: Secondary | ICD-10-CM

## 2020-09-18 ENCOUNTER — Encounter: Payer: Self-pay | Admitting: Physical Therapy

## 2020-09-18 NOTE — Therapy (Signed)
[facility] [address] [place], [place], [address] Phone: [phone]   Fax:  [phone]  Physical Therapy Treatment  Patient Details  Name: Autumn Smith MRN: [id number] Date of Birth: [birth year] Referring Provider (PT): Dr. Valetta Close   Encounter Date: 09/13/2020   PT End of Session - 09/18/20 1044    Visit Number 5    Number of Visits 5    Date for PT Re-Evaluation 09/13/20    Authorization - Visit Number 1    Authorization - Number of Visits 10    PT Start Time 0240    PT Stop Time 1515    PT Time Calculation (min) 47 min    Activity Tolerance Patient tolerated treatment well    Behavior During Therapy [facility] for tasks assessed/performed           Past Medical History:  Diagnosis Date  . Asthma   . Migraines   . Post concussion syndrome     History reviewed. No pertinent surgical history.  There were no vitals filed for this visit.   Subjective Assessment - 09/18/20 1029    Subjective Pt. states she is doing better overall and contributes primary benefit to eyewear.  Pt. returns to massage therapist in 2 weeks.    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    Limitations Other (comment)    Patient Stated Goals Decrease neck pain    Currently in Pain? --   no subjective pain score given   Pain Onset More than a month ago              [facility] PT Assessment - 09/18/20 0001      Assessment   Medical Diagnosis Neck pain, Chronic    Referring Provider (PT) Dr. Valetta Close    Onset Date/Surgical Date 11/05/19      Prior Function   Level of Independence Independent             There.ex:   Supine 2# chest press/ horizontal UE abd/adduction/ alternating shoulder flexion 20x each. Discussed HEP  Manual:  Supine UT/ cervical rotation stretches 3x20 sec.   Supine L/R median, radial and ulnar nerve glides with oscillations (slight increase in  symptoms). Prone STM to B UT/suboccipital region prior to trigger point dry needling to R/L UT trigger points. 3 needles used at 2 trigger points in L and R UT musculature. Good tolerance. Use of hypervolt to R/L UT muscular after tx. Session.       PT Long Term Goals - 09/18/20 1113      PT LONG TERM GOAL #1   Title Pt will report  headaches <2/10 pain at work due to improved posture from therapy treatment.    Baseline 10/12: Pain up to 5/10 NPS    Time 4    Period Weeks    Status Partially Met    Target Date 09/13/20      PT LONG TERM GOAL #2   Title Pt will improve DNF time to > 1 min 30 sec with reports of no cervical pain to display improvement in cervical stability due to hypermobility of vertebrae.    Baseline 10/12: DNF time is1 min with reports of post cervical muscle pain.    Time 4    Period Weeks    Status Partially Met    Target Date 09/13/20      PT LONG TERM GOAL #3   Title Pt will  have no reports of pain in ulnar distribution with B upper trap stretch to improve ability to use hands for job related tasks.    Baseline 10/12: pain in ulnar distribution bilaterally.    Time 4    Period Weeks    Status Partially Met    Target Date 09/13/20               Plan - 09/18/20 1049    Clinical Impression Statement Pt. has received trigger point dry needling to R/L upper trap. musculature to decrease pain.  Pt. tolerates tx. well and has 2 persistent areas of muscle tightness/ trigger points. Slight increase in L upper limb radicular symptoms during ulnar/ median nerve glides in supine position.  Good cervical AROM all planes and correction of upright posture in seated/ standing position.  Pt. understands importance of UE/ postural ex. program with resisted weights (2#).  Pt. will continue with independent ex. program at this time and massage therapy prior to calling PT to check on status/ progress.    Personal Factors and Comorbidities Age;Comorbidity 3+;Past/Current  Experience    Examination-Participation Restrictions Community Activity;Occupation    Stability/Clinical Decision Making Evolving/Moderate complexity    Clinical Decision Making Moderate    Rehab Potential Fair    PT Frequency 2x / week    PT Duration 4 weeks    PT Treatment/Interventions ADLs/Self Care Home Management;Cryotherapy;Electrical Stimulation;Moist Heat;Traction;Functional mobility training;Therapeutic activities;Therapeutic exercise;Neuromuscular re-education;Patient/family education;Dry needling;Manual techniques    PT Next Visit Plan PT to check pts. status after massage therapy visit/ Thanksgiving holiday.    PT Home Exercise Plan Cervical retractions, scap retractions with resistance, upper trap stretch,pec stretch on wall. B shoulder ER with resistance.    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  Pura Spice, PT, DPT # [phone] 09/18/2020, 11:14 AM  [facility] [address] [place], [place], [address] Phone: [phone]   Fax:  [phone]  Name: Autumn Smith MRN: [id number] Date of Birth: [birth year]
